# Patient Record
Sex: Female | Born: 1995 | Race: White | Hispanic: No | Marital: Single | Smoking: Current some day smoker
Health system: Southern US, Community
[De-identification: ages and names within clinical notes are randomized; demographics above are authoritative.]

## PROBLEM LIST (undated history)

## (undated) HISTORY — PX: DENTAL SURGERY: SHX609

## (undated) HISTORY — PX: NO PAST SURGERIES: SHX2092

---
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
## 2020-01-07 ENCOUNTER — Ambulatory Visit
Admission: EM | Admit: 2020-01-07 | Discharge: 2020-01-07 | Disposition: A | Discharge status: Home / Self Care | Payer: Managed Care, Other (non HMO) | Attending: Internal Medicine | Admitting: Internal Medicine

## 2020-01-07 ENCOUNTER — Other Ambulatory Visit: Payer: Self-pay

## 2020-01-07 ENCOUNTER — Encounter: Payer: Self-pay | Admitting: Emergency Medicine

## 2020-01-07 ENCOUNTER — Ambulatory Visit (INDEPENDENT_AMBULATORY_CARE_PROVIDER_SITE_OTHER): Payer: Managed Care, Other (non HMO)

## 2020-01-07 DIAGNOSIS — S6991XA Unspecified injury of right wrist, hand and finger(s), initial encounter: Secondary | ICD-10-CM

## 2020-01-07 DIAGNOSIS — S62623A Displaced fracture of medial phalanx of left middle finger, initial encounter for closed fracture: Secondary | ICD-10-CM

## 2020-01-07 NOTE — ED Triage Notes (Signed)
Patient states that she fell on Monday and landed on her right hand.  Patient c/o pain in her right 3rd finger.

## 2020-01-07 NOTE — Discharge Instructions (Addendum)
IMAGING RESULTS:   Acute, comminuted, obliquely oriented fracture of the distal aspect of the middle phalanx of the third digit with extension to the DIP Joint.  Continue to wear your splint.  Do not use the affected finger until you are cleared by orthopedics.  You can ice the finger and take Tylenol Motrin for pain relief.  You should follow-up with orthopedics in the next couple of days since this fracture is a little bit complicated and may require a minor surgery.  You have a condition requiring you to follow up with Orthopedics so please call one of the following office for appointment:   [facility] [address] [place], [place] [address] Phone: [phone]  [facility] [address], [place], [place] [address] Phone: [phone]

## 2020-01-09 NOTE — ED Provider Notes (Signed)
[facility]    CSN: [id number] Arrival date & time: 01/07/20  1352      History   Chief Complaint Chief Complaint  Patient presents with  . Finger Injury  . Fall    HPI Melissa Strong is a 24 y.o. female.   24 year old female presents for injury of right middle finger.  She says that she fell and jammed the finger 6 days ago.  She has had the finger in the splint and felt that it would get better, but it has not.  She has iced the finger and taken NSAIDs.  She admits to continued swelling and pain as well as problems bending the finger.  She is right-handed she denies any previous injury or surgery of the affected hand.  Patient denies any numbness or tingling.  She has no other concerns today.     History reviewed. No pertinent past medical history.  There are no problems to display for this patient.   Past Surgical History:  Procedure Laterality Date  . NO PAST SURGERIES      OB History   No obstetric history on file.      Home Medications    Prior to Admission medications   Not on File    Family History Family History  Problem Relation Age of Onset  . Diabetes Paternal Grandfather     Social History Social History   Tobacco Use  . Smoking status: Never Smoker  . Smokeless tobacco: Never Used  Substance Use Topics  . Alcohol use: No    Alcohol/week: 0.0 standard drinks  . Drug use: No     Allergies   Nickel   Review of Systems Review of Systems  Constitutional: Negative for fatigue and fever.  Musculoskeletal: Positive for arthralgias and joint swelling.  Skin: Negative for color change, rash and wound.  Neurological: Negative for weakness and numbness.  Hematological: Does not bruise/bleed easily.     Physical Exam Triage Vital Signs ED Triage Vitals  Enc Vitals Group     BP 01/07/20 1455 124/79     Pulse Rate 01/07/20 1455 70     Resp 01/07/20 1455 14     Temp 01/07/20 1455 98.7 F (37.1 C)     Temp Source  01/07/20 1455 Oral     SpO2 01/07/20 1455 99 %     Weight 01/07/20 1453 140 lb (63.5 kg)     Height 01/07/20 1453 5\' 2"  (1.575 m)     Head Circumference --      Peak Flow --      Pain Score 01/07/20 1453 4     Pain Loc --      Pain Edu? --      Excl. in GC? --    No data found.  Updated Vital Signs BP 124/79 (BP Location: Left Arm)   Pulse 70   Temp 98.7 F (37.1 C) (Oral)   Resp 14   Ht 5\' 2"  (1.575 m)   Wt 140 lb (63.5 kg)   LMP 01/01/2020   SpO2 99%   BMI 25.61 kg/m       Physical Exam Vitals and nursing note reviewed.  Constitutional:      General: She is not in acute distress.    Appearance: Normal appearance. She is not ill-appearing or toxic-appearing.  HENT:     Head: Normocephalic and atraumatic.  Eyes:     General: No scleral icterus.       Right eye: No  discharge.        Left eye: No discharge.     Conjunctiva/sclera: Conjunctivae normal.  Cardiovascular:     Rate and Rhythm: Normal rate.     Pulses: Normal pulses.  Pulmonary:     Effort: Pulmonary effort is normal. No respiratory distress.  Musculoskeletal:     Right hand: Swelling (diffuse swelling of right middle finger) and bony tenderness (middle phalanx right hand as well as PIP joint) present. Decreased range of motion (decreased ROM at the PIP joint of right third digit). Normal sensation. Normal capillary refill. Normal pulse.     Left hand: Normal.     Cervical back: Neck supple.  Skin:    General: Skin is dry.  Neurological:     General: No focal deficit present.     Mental Status: She is alert. Mental status is at baseline.     Motor: No weakness.     Gait: Gait normal.  Psychiatric:        Mood and Affect: Mood normal.        Behavior: Behavior normal.        Thought Content: Thought content normal.      UC Treatments / Results  Labs (all labs ordered are listed, but only abnormal results are displayed) Labs Reviewed - No data to display  EKG   Radiology No results  found.  Procedures Procedures (including critical care time)  Medications Ordered in UC Medications - No data to display  Initial Impression / Assessment and Plan / UC Course  I have reviewed the triage vital signs and the nursing notes.  Pertinent labs & imaging results that were available during my care of the patient were reviewed by me and considered in my medical decision making (see chart for details).     I have reviewed the x-rays of the right hand.  There is a fracture of the distal aspect of the middle phalanx with some involvement of the DIP.  Patient is already in a splint fits very well, so I advised her to continue wearing the splint.  Also advised her not to use the affected finger.  She should still ice the finger and take NSAIDs or Tylenol for pain relief.  Advised her to follow-up with orthopedics as soon as possible since there is some involvement of the joint.  Patient given copy of images on the CD.  All questions answered and patient agreeable to follow-up with orthopedics.  Final Clinical Impressions(s) / UC Diagnoses   Final diagnoses:  Closed displaced fracture of middle phalanx of left middle finger, initial encounter     Discharge Instructions     IMAGING RESULTS:   Acute, comminuted, obliquely oriented fracture of the distal aspect of the middle phalanx of the third digit with extension to the DIP Joint.  Continue to wear your splint.  Do not use the affected finger until you are cleared by orthopedics.  You can ice the finger and take Tylenol Motrin for pain relief.  You should follow-up with orthopedics in the next couple of days since this fracture is a little bit complicated and may require a minor surgery.  You have a condition requiring you to follow up with Orthopedics so please call one of the following office for appointment:   [facility] [address] [place], [place] [address] Phone: [phone]  [facility] [address] [place], [place] [address] Phone: [phone]     ED Prescriptions  None     PDMP not reviewed this encounter.   Shirlee Latch, PA-C 01/09/20 1952

## 2020-07-04 ENCOUNTER — Other Ambulatory Visit: Payer: Self-pay

## 2020-07-04 ENCOUNTER — Ambulatory Visit
Admission: EM | Admit: 2020-07-04 | Discharge: 2020-07-04 | Disposition: A | Discharge status: Home / Self Care | Payer: Managed Care, Other (non HMO) | Attending: Sports Medicine | Admitting: Sports Medicine

## 2020-07-04 DIAGNOSIS — R059 Cough, unspecified: Secondary | ICD-10-CM

## 2020-07-04 DIAGNOSIS — J0110 Acute frontal sinusitis, unspecified: Secondary | ICD-10-CM

## 2020-07-04 DIAGNOSIS — R0982 Postnasal drip: Secondary | ICD-10-CM

## 2020-07-04 DIAGNOSIS — R448 Other symptoms and signs involving general sensations and perceptions: Secondary | ICD-10-CM

## 2020-07-04 MED ORDER — FLUTICASONE PROPIONATE 50 MCG/ACT NA SUSP: 1.0000 | Freq: Every day | NASAL | 0 refills | Status: DC

## 2020-07-04 MED ORDER — AMOXICILLIN 500 MG PO CAPS: 500.0000 mg | ORAL_CAPSULE | Freq: Three times a day (TID) | ORAL | 0 refills | Status: DC

## 2020-07-04 NOTE — ED Triage Notes (Signed)
Pt c/o cough and nasal congestion for about 1 week. Pt denies f/n/v/d, shob or other symptoms. Pt has taken Nyquil with no improvement in symptoms.

## 2020-07-04 NOTE — Discharge Instructions (Addendum)
Please take the antibiotics as prescribed. Please use the nasal spray as prescribed. Educational handouts were provided. Over-the-counter meds as needed. Consider doing some sinus flushes. Follow-up here as needed.  I hope you get the feeling better, Dr. Zachery Dauer

## 2020-07-04 NOTE — ED Provider Notes (Signed)
[facility]    CSN: [id number] Arrival date & time: 07/04/20  1553      History   Chief Complaint Chief Complaint  Patient presents with  . Cough    HPI Pamila CAMALA TALWAR is a 25 y.o. female.   Patient is a pleasant 25 year old female who presents for evaluation of the above issues.  She also presents today with her daughter who is having similar symptoms.  She is reported about a week of cough with postnasal drip.  She is also complaining of some facial pain and head pressure.  She has been using over-the-counter medication including sinus rinses without any assistance with her symptoms.  No nausea vomiting diarrhea.  No fever shakes chills.  No ear pain or fullness.  No chest pain or shortness of breath.  No abdominal symptoms or urinary symptoms.  She denies any Covid exposure or COVID history.  She has not been vaccinated against COVID or influenza.  No red flag signs or symptoms appreciated on history.     History reviewed. No pertinent past medical history.  There are no problems to display for this patient.   Past Surgical History:  Procedure Laterality Date  . NO PAST SURGERIES      OB History   No obstetric history on file.      Home Medications    Prior to Admission medications   Medication Sig Start Date End Date Taking? Authorizing Provider  amoxicillin (AMOXIL) 500 MG capsule Take 1 capsule (500 mg total) by mouth 3 (three) times daily. 07/04/20  Yes Delton See, MD  ESTARYLLA 0.25-35 MG-MCG tablet Take 1 tablet by mouth daily. 06/05/20  Yes [provider]  fluticasone (FLONASE) 50 MCG/ACT nasal spray Place 1 spray into both nostrils daily. 07/04/20  Yes Delton See, MD    Family History Family History  Problem Relation Age of Onset  . Diabetes Paternal Grandfather     Social History Social History   Tobacco Use  . Smoking status: Never Smoker  . Smokeless tobacco: Never Used  Vaping Use  . Vaping Use: Never used   Substance Use Topics  . Alcohol use: No    Alcohol/week: 0.0 standard drinks  . Drug use: No     Allergies   Nickel   Review of Systems Review of Systems  Constitutional: Negative for activity change, appetite change, chills, diaphoresis, fatigue and fever.  HENT: Positive for congestion, postnasal drip, sinus pressure and sinus pain. Negative for ear discharge, ear pain, rhinorrhea, sneezing and sore throat.   Eyes: Negative.   Respiratory: Positive for cough. Negative for apnea, chest tightness, shortness of breath, wheezing and stridor.   Cardiovascular: Negative for chest pain and palpitations.  Gastrointestinal: Negative for constipation, diarrhea, nausea and vomiting.  Genitourinary: Negative.   Musculoskeletal: Negative.  Negative for myalgias.  Skin: Negative.  Negative for wound.  Neurological: Negative for dizziness, seizures, syncope, light-headedness, numbness and headaches.  All other systems reviewed and are negative.    Physical Exam Triage Vital Signs ED Triage Vitals  Enc Vitals Group     BP 07/04/20 1628 119/67     Pulse Rate 07/04/20 1628 85     Resp 07/04/20 1628 18     Temp 07/04/20 1628 98.6 F (37 C)     Temp Source 07/04/20 1628 Oral     SpO2 07/04/20 1628 99 %     Weight 07/04/20 1627 130 lb (59 kg)     Height 07/04/20 1627 5\' 2"  (  1.575 m)     Head Circumference --      Peak Flow --      Pain Score 07/04/20 1627 0     Pain Loc --      Pain Edu? --      Excl. in GC? --    No data found.  Updated Vital Signs BP 119/67 (BP Location: Left Arm)   Pulse 85   Temp 98.6 F (37 C) (Oral)   Resp 18   Ht 5\' 2"  (1.575 m)   Wt 59 kg   LMP 06/13/2020   SpO2 99%   BMI 23.78 kg/m   Visual Acuity Right Eye Distance:   Left Eye Distance:   Bilateral Distance:    Right Eye Near:   Left Eye Near:    Bilateral Near:     Physical Exam Vitals and nursing note reviewed.  Constitutional:      General: She is not in acute distress.     Appearance: Normal appearance. She is not ill-appearing or toxic-appearing.  HENT:     Head: Normocephalic and atraumatic.     Right Ear: Tympanic membrane normal.     Left Ear: Tympanic membrane normal.     Nose: Congestion present. No rhinorrhea.     Mouth/Throat:     Mouth: Mucous membranes are moist.     Pharynx: Posterior oropharyngeal erythema present. No oropharyngeal exudate.     Tonsils: No tonsillar exudate or tonsillar abscesses.  Eyes:     General: No scleral icterus.       Right eye: No discharge.        Left eye: No discharge.     Extraocular Movements: Extraocular movements intact.     Conjunctiva/sclera: Conjunctivae normal.     Pupils: Pupils are equal, round, and reactive to light.  Cardiovascular:     Rate and Rhythm: Normal rate and regular rhythm.     Pulses: Normal pulses.     Heart sounds: Normal heart sounds. No murmur heard. No friction rub. No gallop.   Pulmonary:     Effort: Pulmonary effort is normal. No respiratory distress.     Breath sounds: Normal breath sounds. No stridor. No wheezing, rhonchi or rales.  Musculoskeletal:     Cervical back: Normal range of motion and neck supple. No rigidity or tenderness.  Lymphadenopathy:     Cervical: Cervical adenopathy present.  Skin:    General: Skin is warm and dry.     Capillary Refill: Capillary refill takes less than 2 seconds.  Neurological:     General: No focal deficit present.     Mental Status: She is alert and oriented to person, place, and time.      UC Treatments / Results  Labs (all labs ordered are listed, but only abnormal results are displayed) Labs Reviewed - No data to display  EKG   Radiology No results found.  Procedures Procedures (including critical care time)  Medications Ordered in UC Medications - No data to display  Initial Impression / Assessment and Plan / UC Course  I have reviewed the triage vital signs and the nursing notes.  Pertinent labs & imaging  results that were available during my care of the patient were reviewed by me and considered in my medical decision making (see chart for details).  Clinical impression:  1 week of facial pain and pressure in her head with postnasal drip and cough.  Seems consistent with sinusitis.  Will treat accordingly.  Treatment plan:  1.  The findings and treatment plan were discussed in detail with the patient.  Patient was in agreement. 2.  We will treat with amoxicillin 500 mg 3 times a day. 3.  Also give her Flonase. 4.  Educational handout was provided.  Encouraged her to do sinus rinses with supportive care. 4.  Over-the-counter meds as needed, Tylenol or Motrin for fever or discomfort.  Plenty of rest plenty fluids. 5.  Offered a work note but she stays at home did not need one. 6.  If she is having a problem she should follow-up with her primary care physician, come back here, or if her symptoms were to worsen in any way that required a higher level of care she should go to the emergency room.  She voiced verbal understanding. 7.  Follow-up here as needed.    Final Clinical Impressions(s) / UC Diagnoses   Final diagnoses:  Acute non-recurrent frontal sinusitis  Cough  Facial pressure  Postnasal drip     Discharge Instructions     Please take the antibiotics as prescribed. Please use the nasal spray as prescribed. Educational handouts were provided. Over-the-counter meds as needed. Consider doing some sinus flushes. Follow-up here as needed.  I hope you get the feeling better, Dr. Zachery Dauer    ED Prescriptions    Medication Sig Dispense Auth. Provider   amoxicillin (AMOXIL) 500 MG capsule Take 1 capsule (500 mg total) by mouth 3 (three) times daily. 21 capsule Delton See, MD   fluticasone [facility]) 50 MCG/ACT nasal spray Place 1 spray into both nostrils daily. 15.8 mL Delton See, MD     PDMP not reviewed this encounter.   Delton See, MD 07/08/20 1221

## 2022-01-17 IMAGING — CR DG FINGER MIDDLE 2+V*R*
3 series · 3 of 3 positions shown · non-contrast
Comparison: None.

CLINICAL DATA: Patient fell on [REDACTED] injuring the third digit.

EXAM:
RIGHT MIDDLE FINGER 2+V

[finger ap]
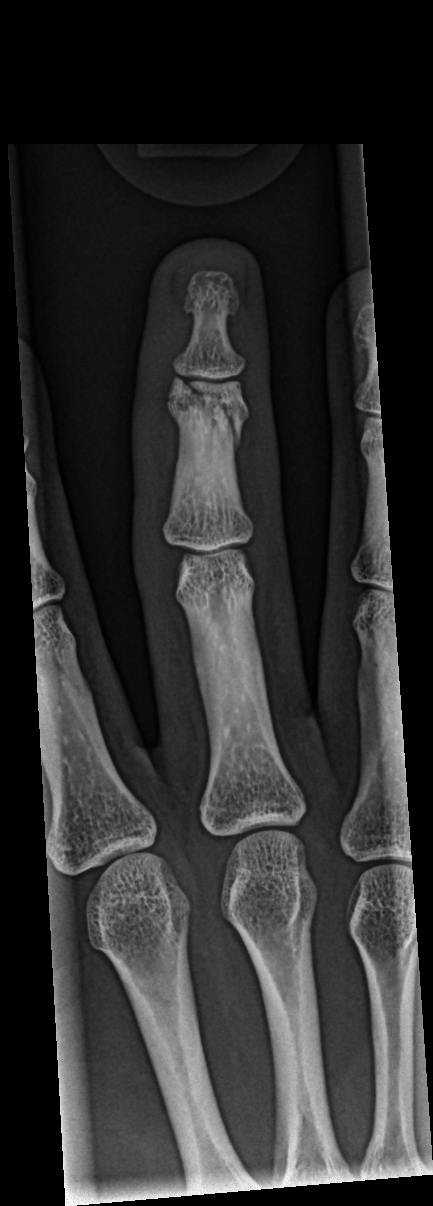

[finger obl]
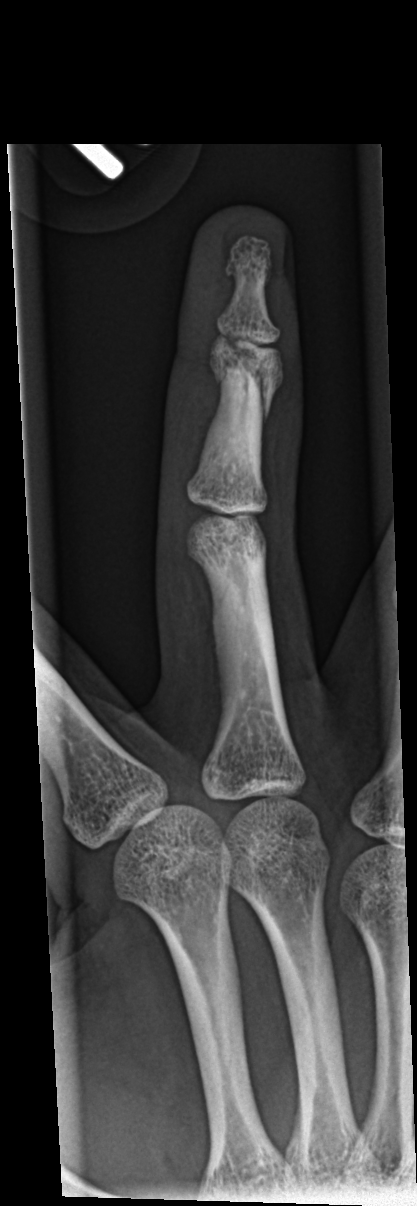

[finger lat]
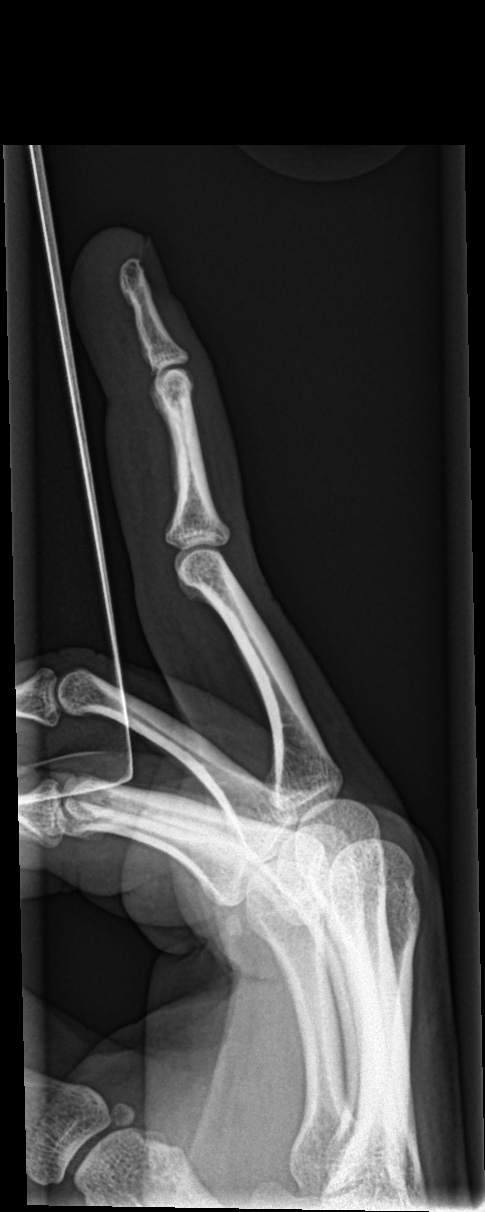

[3 of 3 positions shown; findings below may reference images not displayed]

FINDINGS: There is an acute comminuted obliquely oriented minimally displaced
fracture involving the distal aspect of the middle phalanx of the
third digit with extension to involve the DIP joint. No additional
fractures identified. Expected adjacent soft tissue swelling. No
radiopaque foreign body. No dislocation.
IMPRESSION: Acute, comminuted, obliquely oriented fracture of the distal aspect
of the middle phalanx of the third digit with extension to the DIP
joint.

## 2022-09-26 ENCOUNTER — Ambulatory Visit
Admission: EM | Admit: 2022-09-26 | Discharge: 2022-09-26 | Disposition: A | Discharge status: Home / Self Care | Payer: Managed Care, Other (non HMO) | Attending: Family Medicine | Admitting: Family Medicine

## 2022-09-26 ENCOUNTER — Encounter: Payer: Self-pay | Admitting: Emergency Medicine

## 2022-09-26 DIAGNOSIS — L0501 Pilonidal cyst with abscess: Secondary | ICD-10-CM

## 2022-09-26 MED ORDER — METRONIDAZOLE 500 MG PO TABS: 500.0000 mg | ORAL_TABLET | Freq: Three times a day (TID) | ORAL | 0 refills | Status: DC

## 2022-09-26 MED ORDER — TRAMADOL HCL 50 MG PO TABS: 50.0000 mg | ORAL_TABLET | Freq: Three times a day (TID) | ORAL | 0 refills | Status: DC | PRN

## 2022-09-26 MED ORDER — CEPHALEXIN 500 MG PO CAPS: 500.0000 mg | ORAL_CAPSULE | Freq: Three times a day (TID) | ORAL | 0 refills | Status: DC

## 2022-09-26 NOTE — ED Provider Notes (Signed)
[facility]    CSN: [id number] Arrival date & time: 09/26/22  0931      History   Chief Complaint Chief Complaint  Patient presents with   Tailbone Pain   Abscess    HPI 27 year old female presents for evaluation of the above.  Patient reports that over few days she has had pain of her tailbone.  She states that she had someone look at the area and it was red.  It causes significant pain with ambulation and when she lies down on it.  No drainage from the area.  No fever.  No relieving factors.  No other complaints.    Home Medications    Prior to Admission medications   Medication Sig Start Date End Date Taking? Authorizing Provider  cephALEXin (KEFLEX) 500 MG capsule Take 1 capsule (500 mg total) by mouth 3 (three) times daily. 09/26/22  Yes Kelissa Merlin G, DO  metroNIDAZOLE (FLAGYL) 500 MG tablet Take 1 tablet (500 mg total) by mouth 3 (three) times daily. 09/26/22  Yes Pailynn Vahey G, DO  traMADol (ULTRAM) 50 MG tablet Take 1 tablet (50 mg total) by mouth every 8 (eight) hours as needed for severe pain or moderate pain. 09/26/22  Yes Tommie Sams, DO    Family History Family History  Problem Relation Age of Onset   Diabetes Paternal Grandfather     Social History Social History   Tobacco Use   Smoking status: Never   Smokeless tobacco: Never  Vaping Use   Vaping Use: Never used  Substance Use Topics   Alcohol use: No    Alcohol/week: 0.0 standard drinks of alcohol   Drug use: No     Allergies   Nickel   Review of Systems Review of Systems Per HPI  Physical Exam Triage Vital Signs ED Triage Vitals  Enc Vitals Group     BP 09/26/22 0946 116/75     Pulse Rate 09/26/22 0946 (!) 109     Resp 09/26/22 0946 14     Temp 09/26/22 0946 98.5 F (36.9 C)     Temp Source 09/26/22 0946 Oral     SpO2 09/26/22 0946 96 %     Weight 09/26/22 0943 175 lb (79.4 kg)     Height 09/26/22 0943 5\' 2"  (1.575 m)     Head Circumference --      Peak Flow --       Pain Score 09/26/22 0943 8     Pain Loc --      Pain Edu? --      Excl. in GC? --    No data found.  Updated Vital Signs BP 116/75 (BP Location: Right Arm)   Pulse (!) 109   Temp 98.5 F (36.9 C) (Oral)   Resp 14   Ht 5\' 2"  (1.575 m)   Wt 79.4 kg   LMP 09/05/2022 (Approximate)   SpO2 96%   BMI 32.01 kg/m   Visual Acuity Right Eye Distance:   Left Eye Distance:   Bilateral Distance:    Right Eye Near:   Left Eye Near:    Bilateral Near:     Physical Exam Vitals and nursing note reviewed. Exam conducted with a chaperone present.  Constitutional:      General: She is not in acute distress.    Appearance: Normal appearance.  HENT:     Head: Normocephalic and atraumatic.  Skin:    Comments: Superior aspect of the left buttock at the  gluteal cleft with a firm palpable area with erythema.  Fluctuant.  Neurological:     Mental Status: She is alert.      UC Treatments / Results  Labs (all labs ordered are listed, but only abnormal results are displayed) Labs Reviewed - No data to display  EKG   Radiology No results found.  Procedures Incision and Drainage  Date/Time: 09/26/2022 11:15 AM  Performed by: Tommie Sams, DO Authorized by: Tommie Sams, DO   Consent:    Consent obtained:  Verbal   Consent given by:  Patient   Risks discussed:  Pain   Alternatives discussed:  Alternative treatment Universal protocol:    Patient identity confirmed:  Verbally with patient Location:    Type:  Abscess   Location:  Anogenital   Anogenital location:  Pilonidal Pre-procedure details:    Skin preparation:  Povidone-iodine Anesthesia:    Anesthesia method:  Local infiltration   Local anesthetic:  Lidocaine 1% WITH epi Procedure type:    Complexity:  Simple Procedure details:    Incision types:  Stab incision   Drainage:  Purulent   Drainage amount:  Scant   Wound treatment:  Wound left open   Packing materials:  None Post-procedure details:     Procedure completion:  Tolerated with difficulty  (including critical care time)  Medications Ordered in UC Medications - No data to display  Initial Impression / Assessment and Plan / UC Course  I have reviewed the triage vital signs and the nursing notes.  Pertinent labs & imaging results that were available during my care of the patient were reviewed by me and considered in my medical decision making (see chart for details).    27 year old female presents with pilonidal abscess.  I&D performed today with minimal drainage.  Advised warm soaks.  Antibiotic therapy as directed.  Tramadol as needed for pain.  Advised to follow-up with general surgery if this continues to persist and does not resolve.  Final Clinical Impressions(s) / UC Diagnoses   Final diagnoses:  Pilonidal abscess     Discharge Instructions      Warm soaks.  Medications as prescribed.  If continues to persist, recommend seeing [facility] in [place].   ED Prescriptions     Medication Sig Dispense Auth. Provider   cephALEXin (KEFLEX) 500 MG capsule Take 1 capsule (500 mg total) by mouth 3 (three) times daily. 20 capsule Elray Dains G, DO   metroNIDAZOLE (FLAGYL) 500 MG tablet Take 1 tablet (500 mg total) by mouth 3 (three) times daily. 21 tablet Deyon Chizek G, DO   traMADol (ULTRAM) 50 MG tablet Take 1 tablet (50 mg total) by mouth every 8 (eight) hours as needed for severe pain or moderate pain. 15 tablet Everlene Other G, DO      I have reviewed the PDMP during this encounter.   Tommie Sams, [place] 09/26/22 1118

## 2022-09-26 NOTE — Discharge Instructions (Addendum)
Warm soaks.  Medications as prescribed.  If continues to persist, recommend seeing [facility] in [place].

## 2022-09-26 NOTE — ED Triage Notes (Signed)
Patient c/o tailbone pain and redness and swelling in the area for the past 2-3 days.  Patient reports N/D.  Patient denies injury or fall.  Patient unsure of fevers.

## 2022-09-29 ENCOUNTER — Emergency Department
Admission: EM | Admit: 2022-09-29 | Discharge: 2022-09-29 | Disposition: A | Discharge status: Home / Self Care | Payer: Self-pay | Attending: Emergency Medicine | Admitting: Emergency Medicine

## 2022-09-29 ENCOUNTER — Other Ambulatory Visit: Payer: Self-pay

## 2022-09-29 DIAGNOSIS — L0501 Pilonidal cyst with abscess: Secondary | ICD-10-CM | POA: Insufficient documentation

## 2022-09-29 MED ORDER — HYDROCODONE-ACETAMINOPHEN 5-325 MG PO TABS
2.0000 | ORAL_TABLET | Freq: Once | ORAL | Status: AC
  Administered 2022-09-29: 2 via ORAL
  Filled 2022-09-29: qty 2

## 2022-09-29 MED ORDER — CLINDAMYCIN HCL 150 MG PO CAPS
600.0000 mg | ORAL_CAPSULE | Freq: Once | ORAL | Status: AC
  Administered 2022-09-29: 600 mg via ORAL
  Filled 2022-09-29: qty 4

## 2022-09-29 MED ORDER — CLINDAMYCIN HCL 300 MG PO CAPS: 300.0000 mg | ORAL_CAPSULE | Freq: Four times a day (QID) | ORAL | 0 refills | Status: AC

## 2022-09-29 NOTE — ED Provider Notes (Signed)
[facility] Provider Note    Event Date/Time   First MD Initiated Contact with Patient 09/29/22 1941     (approximate)   History   Wound Check   HPI  Melissa Strong is a 27 y.o. female with no past medical history who presents to the ED today for a wound check. Patient was seen on 09/26/2022 and diagnosed with a pilonidal abscess, which was drained in urgent care.  She was started on Keflex and Flagyl.  Today she noticed increased drainage while she was riding in a car.  She shows me a picture of purulent fluid pooled in the seat, and states that it has continued to ooze and bleed.  She also mentions that she has been very nauseated and has had some abdominal pain since starting the antibiotics.  No fevers, vomiting or diarrhea.      Physical Exam   Triage Vital Signs: ED Triage Vitals  Enc Vitals Group     BP 09/29/22 1839 118/61     Pulse Rate 09/29/22 1839 99     Resp 09/29/22 1839 16     Temp 09/29/22 1839 98.6 F (37 C)     Temp Source 09/29/22 1839 Oral     SpO2 09/29/22 1839 96 %     Weight 09/29/22 1840 175 lb (79.4 kg)     Height 09/29/22 1840 5\' 2"  (1.575 m)     Head Circumference --      Peak Flow --      Pain Score 09/29/22 1840 6     Pain Loc --      Pain Edu? --      Excl. in GC? --     Most recent vital signs: Vitals:   09/29/22 1839  BP: 118/61  Pulse: 99  Resp: 16  Temp: 98.6 F (37 C)  SpO2: 96%    General: Awake, no distress.  CV:  Good peripheral perfusion.  Resp:  Normal effort.  Abd:  No distention.  Skin:  1.5 cm circular draining abscess on the superior aspect of the left buttock at the gluteal cleft with surrounding erythema and fluctuance, TTP.  Fluid is purulent and bloody.   ED Results / Procedures / Treatments   Labs (all labs ordered are listed, but only abnormal results are displayed) Labs Reviewed  AEROBIC/ANAEROBIC CULTURE W GRAM STAIN (SURGICAL/DEEP WOUND)    PROCEDURES:  Critical Care  performed: No  Procedures   MEDICATIONS ORDERED IN ED: Medications  HYDROcodone-acetaminophen (NORCO/VICODIN) 5-325 MG per tablet 2 tablet (2 tablets Oral Given 09/29/22 2020)  clindamycin (CLEOCIN) capsule 600 mg (600 mg Oral Given 09/29/22 2019)     IMPRESSION / MDM / ASSESSMENT AND PLAN / ED COURSE  I reviewed the triage vital signs and the nursing notes.                              Differential diagnosis includes, but is not limited to, pilonidal abscess, pilonidal cyst, perianal abscess, anal fissure, anal fistula.  Patient's presentation is most consistent with acute, uncomplicated illness.  Patient presented to the ED for a wound check after having a pilonidal abscess drained in urgent care on 5/25.  She had an increase in purulent drainage.  She has been taking Keflex and Flagyl x 3 days which have led to GI upset.  Wound culture was obtained to check for MRSA as well as test antibiotic sensitivity.  I changed patient's antibiotics to clindamycin for MRSA coverage.  She was given a dose of oral clindamycin in the ED as well as some Vicodin for pain control.  Wound was covered with an abdominal pad taped to the buttock.  I explained to patient that it will continue to drain and that someone would reach out to her if the bacteria is not sensitive to the antibiotic she was placed on.  I advised her to continue to keep the wound covered to the best of her ability and suggested she try wearing pads to help with the drainage.  I gave her information for general surgery follow-up.  I explained that she may want to start a probiotic given all of her recent antibiotic usage.  She can also take sitz bath's for symptom relief.  Patient verbalized understanding, all questions were answered, patient stable for discharge.     FINAL CLINICAL IMPRESSION(S) / ED DIAGNOSES   Final diagnoses:  Pilonidal abscess    Rx / DC Orders   ED Discharge Orders          Ordered    clindamycin (CLEOCIN)  300 MG capsule  4 times daily        09/29/22 2013             Note:  This document was prepared using Dragon voice recognition software and may include unintentional dictation errors.   Cruz Condon, PA-C 09/29/22 2123    Concha Se, MD 10/03/22 [phone]

## 2022-09-29 NOTE — ED Triage Notes (Signed)
Pt states that she had an abscess at her tailbone that was opened on Saturday at urgent care, pt was placed on antibiotcs Saturday, states today while riding in the car it felt like it burst open and has cont to bleed on ooze since, pt also states that she feels nauseated

## 2022-09-30 LAB — AEROBIC/ANAEROBIC CULTURE W GRAM STAIN (SURGICAL/DEEP WOUND)

## 2022-10-02 LAB — AEROBIC/ANAEROBIC CULTURE W GRAM STAIN (SURGICAL/DEEP WOUND)

## 2022-10-03 LAB — AEROBIC/ANAEROBIC CULTURE W GRAM STAIN (SURGICAL/DEEP WOUND)

## 2022-12-24 ENCOUNTER — Encounter: Payer: Self-pay | Admitting: Surgery

## 2022-12-24 ENCOUNTER — Ambulatory Visit (INDEPENDENT_AMBULATORY_CARE_PROVIDER_SITE_OTHER): Payer: Self-pay | Admitting: Surgery

## 2022-12-24 VITALS — BP 111/74 | HR 61 | Temp 98.3°F | Ht 62.0 in | Wt 175.0 lb

## 2022-12-24 DIAGNOSIS — L0591 Pilonidal cyst without abscess: Secondary | ICD-10-CM

## 2022-12-24 DIAGNOSIS — L0501 Pilonidal cyst with abscess: Secondary | ICD-10-CM

## 2022-12-24 MED ORDER — CLINDAMYCIN HCL 300 MG PO CAPS: 300.0000 mg | ORAL_CAPSULE | Freq: Four times a day (QID) | ORAL | 0 refills | Status: AC

## 2022-12-24 NOTE — Patient Instructions (Signed)
We will send a prescription for antibiotics to your pharmacy. We will send you home with some Silver Nitrate sticks to help probe the wound and keep it open to drain.   You have requested to have your gallbladder removed. This will be done at [facility] with Dr. Claudine Mouton.  You will need to arrange to be out of work for approximately 1-2 weeks and then have a family member change the dressing 1 time a day until this heals from the inside out.  If you have FMLA or disability paperwork that needs filled out you may drop this off at our office or this can be faxed to [phone].  Please see the (blue)pre-care form that you have been given today. Our surgery scheduler will call you to verify surgery date and to go over information.   If you have any questions, please call our office.   Please call our office with any questions or concerns.    Pilonidal Cyst A pilonidal cyst is a fluid-filled sac. It forms beneath the skin near your tailbone, at the top of the crease of your buttocks. A pilonidal cyst that is not large or infected may not cause symptoms or problems. If the cyst becomes irritated or infected, it may fill with pus. This causes pain and swelling (pilonidal abscess). An infected cyst may need to be treated with medicine, drained, or removed. CAUSES The cause of a pilonidal cyst is not known. One cause may be a hair that grows into your skin (ingrown hair). RISK FACTORS Pilonidal cysts are more common in boys and men. Risk factors include: Having lots of hair near the crease of the buttocks. Being overweight. Having a pilonidal dimple. Wearing tight clothing. Not bathing or showering frequently. Sitting for long periods of time. SIGNS AND SYMPTOMS Signs and symptoms of a pilonidal cyst may include: Redness. Pain and tenderness. Warmth. Swelling. Pus. Fever. DIAGNOSIS Your health care provider may diagnose a pilonidal cyst based on your symptoms and a physical  exam. The health care provider may do a blood test to check for infection. If your cyst is draining pus, your health care provider may take a sample of the drainage to be tested at a laboratory. TREATMENT Surgery is the usual treatment for an infected pilonidal cyst. You may also have to take medicines before surgery. The type of surgery you have depends on the size and severity of the infected cyst. The different kinds of surgery include: Incision and drainage. This is a procedure to open and drain the cyst. Marsupialization. In this procedure, a large cyst or abscess may be opened and kept open by stitching the edges of the skin to the cyst walls. Cyst removal. This procedure involves opening the skin and removing all or part of the cyst. HOME CARE INSTRUCTIONS Follow all of your surgeon's instructions carefully if you had surgery. Take medicines only as directed by your health care provider. If you were prescribed an antibiotic medicine, finish it all even if you start to feel better. Keep the area around your pilonidal cyst clean and dry. Clean the area as directed by your health care provider. Pat the area dry with a clean towel. Do not rub it as this may cause bleeding. Remove hair from the area around the cyst as directed by your health care provider. Do not wear tight clothing or sit in one place for long periods of time. There are many different ways to close and cover an incision, including stitches, skin  glue, and adhesive strips. Follow your health care provider's instructions on: Incision care. Bandage (dressing) changes and removal. Incision closure removal. SEEK MEDICAL CARE IF:  You have drainage, redness, swelling, or pain at the site of the cyst. You have a fever.   This information is not intended to replace advice given to you by your health care provider. Make sure you discuss any questions you have with your health care provider.   Document Released: 04/17/2000 Document  Revised: 05/11/2014 Document Reviewed: 09/07/2013 Elsevier Interactive Patient Education 2016 [employer] Pilonidal Cyst Drainage  A pilonidal cyst is a fluid-filled sac that forms under the skin near the tailbone, at the top of the crease of the buttocks (pilonidal area). Incision and drainage is a procedure to open and drain a pilonidal cyst. You may need this procedure if your cyst becomes infected (pilonidal abscess). A pilonidal abscess may cause pain and swelling. There are three types of procedures to drain a pilonidal cyst. The type of procedure you have will depend on the size, location, and severity of your cyst. You may have: Incision and drainage with wound packing. Wound packing involves placing a germ-free packing material (gauze) into the incision. Marsupialization. This involves opening and draining the cyst. The edges of the incision are then stitched (sutured) to the skin around the incision. Incision and drainage without wound packing. The incision is closed and covered with a dressing. Tell a health care provider about: Any allergies you have. All medicines you are taking, including vitamins, herbs, eye drops, creams, and over-the-counter medicines. Any problems you or family members have had with anesthetic medicines. Any bleeding problems you have. Any surgeries you have had. Any medical conditions you have. Whether you are pregnant or may be pregnant. What are the risks? Talk to your health care provider about risks. These may include: Infection. Bleeding. Allergic reactions to medicines. The cyst coming back again (recurrence). What happens before the procedure? Medicines Ask your health care provider about: Changing or stopping your regular medicines. These include any diabetes medicines or blood thinners you take. Taking medicines such as aspirin and ibuprofen. These medicines can thin your blood. Do not take them unless your health care provider tells you  to. Taking over-the-counter medicines, vitamins, herbs, and supplements. Surgery safety Ask your health care provider: How your surgery site will be marked. What steps will be taken to help prevent infection. These steps may include: Removing hair at the surgery site. Washing skin with a soap that kills germs. Taking antibiotics. General instructions Do not use any products that contain nicotine or tobacco for at least 4 weeks before the procedure. These products include cigarettes, chewing tobacco, and vaping devices, such as e-cigarettes. If you need help quitting, ask your health care provider. If you will be going home right after the procedure, plan to have a responsible adult: Take you home from the hospital or clinic. You will not be allowed to drive. Care for you for the time you are told. You may need help with wound care and dressing changes. What happens during the procedure? An IV may be inserted into one of your veins. You may be given: A sedative. This helps you relax. Anesthesia. This will: Numb certain areas of your body. Make you fall asleep for surgery. You will lie down on your stomach. Tape may be used to spread your buttocks. The area around the cyst will be cleaned. An incision will be made in the cyst. Your surgeon may insert a  tube with a light and camera on the end (probe). This is done to see how deep the cyst is. Fluid or pus inside the cyst will be drained. The cyst will be flushed out (irrigated). The rest of the procedure will depend on the type of procedure you are having. For incision and drainage with wound packing: Gauze will be placed into the wound. This keeps the wound open so it can keep draining after surgery. It also helps the wound heal from the inside out. The area will be covered with a dressing. For marsupialization: The edges of the incision will be stitched to your skin. This keeps the wound open while it heals. It also helps deep wounds  heal from the inside out. A dressing will be placed over the wound. For incision and drainage without wound packing: Some tissue around the cyst may be removed. The incision may be closed with sutures, skin glue, or adhesive strips. The incision will be covered with a dressing. These procedures may vary among health care providers and hospitals. What happens after the procedure? Your blood pressure, heart rate, breathing rate, and blood oxygen level will be monitored until you leave the hospital or clinic. You will be given medicine for pain as needed. If you were given a sedative during the procedure, it can affect you for several hours. Do not drive or operate machinery until your health care provider says that it is safe. Summary A pilonidal cyst is a fluid-filled sac that forms under the skin near the tailbone, at the top of the crease of the buttocks (pilonidal area). Incision and drainage is a procedure to open and drain the cyst. The type of procedure you have will depend on the size, location, and severity of your cyst. You may need this procedure if your cyst becomes infected (pilonidal abscess). This information is not intended to replace advice given to you by your health care provider. Make sure you discuss any questions you have with your health care provider. Document Revised: 07/16/2021 Document Reviewed: 07/16/2021 Elsevier Patient Education  2024 [employer].

## 2022-12-24 NOTE — Progress Notes (Signed)
Patient ID: Melissa Strong, female   DOB: 12/21/1995, 27 y.o.   MRN: [id number]  Chief Complaint: Pilonidal abscess, recurrent  History of Present Illness Melissa Strong is a 27 y.o. female with prior history of natal cleft/sacral area abscess, underwent I&D back in May.  The area again swelled up and became quite painful.  Began draining yesterday, with significant pain relief and after evaluation no antibiotics were reinitiated.  Past Medical History No past medical history on file.    Past Surgical History:  Procedure Laterality Date   NO PAST SURGERIES      Allergies  Allergen Reactions   Nickel Hives and Itching    Current Outpatient Medications  Medication Sig Dispense Refill   clindamycin (CLEOCIN) 300 MG capsule Take 1 capsule (300 mg total) by mouth 4 (four) times daily for 7 days. 28 capsule 0   No current facility-administered medications for this visit.    Family History Family History  Problem Relation Age of Onset   Diabetes Paternal Grandfather       Social History Social History   Tobacco Use   Smoking status: Never    Passive exposure: Never   Smokeless tobacco: Never  Vaping Use   Vaping status: Never Used  Substance Use Topics   Alcohol use: No    Alcohol/week: 0.0 standard drinks of alcohol   Drug use: No        Review of Systems  Constitutional: Negative.   HENT: Negative.    Eyes: Negative.   Respiratory: Negative.    Cardiovascular: Negative.   Gastrointestinal: Negative.   Genitourinary: Negative.   Skin: Negative.   Neurological: Negative.   Psychiatric/Behavioral: Negative.       Physical Exam Blood pressure 111/74, pulse 61, temperature 98.3 F (36.8 C), height 5\' 2"  (1.575 m), weight 175 lb (79.4 kg), last menstrual period 12/20/2022, SpO2 98%. Last Weight  Most recent update: 12/24/2022  1:57 PM    Weight  79.4 kg (175 lb)             CONSTITUTIONAL: Well developed, and nourished, appropriately responsive and  aware without distress.   EYES: Sclera non-icteric.   EARS, NOSE, MOUTH AND THROAT: The oropharynx is clear. Oral mucosa is pink and moist.     Hearing is intact to voice.  NECK: Trachea is midline, and there is no jugular venous distension.  LYMPH NODES:  Lymph nodes in the neck are not appreciated. RESPIRATORY:  Lungs are clear, and breath sounds are equal bilaterally.  Normal respiratory effort without pathologic use of accessory muscles. CARDIOVASCULAR: Heart is regular in rate and rhythm.   Well perfused.  GI: The abdomen is  soft, nontender, and nondistended. There were no palpable masses.  I did not appreciate hepatosplenomegaly. GU: At the cephalad aspect of the natal cleft there is a single pit that does not appear inflamed or indurated.  Just cephalad and to the left of this site there is a indurated area with a punctate opening that is discolored and without obvious drainage at this time.  Q-tip probe was too large, so I converted to a silver nitrate probing patency of the sinus tract.  There appears to be no significant residual drainage to require.  Maintenance minimal tenderness but some evidence of induration/edema to the area.  MUSCULOSKELETAL:  Symmetrical muscle tone appreciated in all four extremities.    SKIN: Skin turgor is normal. No pathologic skin lesions appreciated.  NEUROLOGIC:  Motor and sensation appear grossly normal.  Cranial nerves are grossly without defect. PSYCH:  Alert and oriented to person, place and time. Affect is appropriate for situation.  Data Reviewed I have personally reviewed what is currently available of the patient's imaging, recent labs and medical records.   Labs:      No data to display             No data to display            Imaging: Radiological images reviewed:   Within last 24 hrs: No results found.  Assessment    Pilonidal cyst, recurrent abscess.  Currently draining. Patient Active Problem List   Diagnosis Date  Noted   Pilonidal cyst 12/25/2022    Plan    We discussed proceeding with definitive excision.  Will start antibiotics hopefully to ward off any infection prior to her surgery.  Next week.  She will continue to probe with silver nitrate sticks.  We discussed excision with healing by secondary intention.  We discussed various other options and she agrees to pursue that with the least risk of recurrence.  We reviewed the risks of bleeding, infection, anesthesia, the need for postoperative wound care.  Believe she understands and desires to proceed. Face-to-face time spent with the patient and accompanying care providers(if present) was 30 minutes, with more than 50% of the time spent counseling, educating, and coordinating care of the patient.    These notes generated with voice recognition software. I apologize for typographical errors.  Campbell Lerner M.D., FACS 12/25/2022, 8:36 PM

## 2022-12-24 NOTE — H&P (View-Only) (Signed)
 Patient ID: Melissa Strong, female   DOB: 12/21/1995, 27 y.o.   MRN: [id number]  Chief Complaint: Pilonidal abscess, recurrent  History of Present Illness Melissa Strong is a 27 y.o. female with prior history of natal cleft/sacral area abscess, underwent I&D back in May.  The area again swelled up and became quite painful.  Began draining yesterday, with significant pain relief and after evaluation no antibiotics were reinitiated.  Past Medical History No past medical history on file.    Past Surgical History:  Procedure Laterality Date   NO PAST SURGERIES      Allergies  Allergen Reactions   Nickel Hives and Itching    Current Outpatient Medications  Medication Sig Dispense Refill   clindamycin (CLEOCIN) 300 MG capsule Take 1 capsule (300 mg total) by mouth 4 (four) times daily for 7 days. 28 capsule 0   No current facility-administered medications for this visit.    Family History Family History  Problem Relation Age of Onset   Diabetes Paternal Grandfather       Social History Social History   Tobacco Use   Smoking status: Never    Passive exposure: Never   Smokeless tobacco: Never  Vaping Use   Vaping status: Never Used  Substance Use Topics   Alcohol use: No    Alcohol/week: 0.0 standard drinks of alcohol   Drug use: No        Review of Systems  Constitutional: Negative.   HENT: Negative.    Eyes: Negative.   Respiratory: Negative.    Cardiovascular: Negative.   Gastrointestinal: Negative.   Genitourinary: Negative.   Skin: Negative.   Neurological: Negative.   Psychiatric/Behavioral: Negative.       Physical Exam Blood pressure 111/74, pulse 61, temperature 98.3 F (36.8 C), height 5\' 2"  (1.575 m), weight 175 lb (79.4 kg), last menstrual period 12/20/2022, SpO2 98%. Last Weight  Most recent update: 12/24/2022  1:57 PM    Weight  79.4 kg (175 lb)             CONSTITUTIONAL: Well developed, and nourished, appropriately responsive and  aware without distress.   EYES: Sclera non-icteric.   EARS, NOSE, MOUTH AND THROAT: The oropharynx is clear. Oral mucosa is pink and moist.     Hearing is intact to voice.  NECK: Trachea is midline, and there is no jugular venous distension.  LYMPH NODES:  Lymph nodes in the neck are not appreciated. RESPIRATORY:  Lungs are clear, and breath sounds are equal bilaterally.  Normal respiratory effort without pathologic use of accessory muscles. CARDIOVASCULAR: Heart is regular in rate and rhythm.   Well perfused.  GI: The abdomen is  soft, nontender, and nondistended. There were no palpable masses.  I did not appreciate hepatosplenomegaly. GU: At the cephalad aspect of the natal cleft there is a single pit that does not appear inflamed or indurated.  Just cephalad and to the left of this site there is a indurated area with a punctate opening that is discolored and without obvious drainage at this time.  Q-tip probe was too large, so I converted to a silver nitrate probing patency of the sinus tract.  There appears to be no significant residual drainage to require.  Maintenance minimal tenderness but some evidence of induration/edema to the area.  MUSCULOSKELETAL:  Symmetrical muscle tone appreciated in all four extremities.    SKIN: Skin turgor is normal. No pathologic skin lesions appreciated.  NEUROLOGIC:  Motor and sensation appear grossly normal.  Cranial nerves are grossly without defect. PSYCH:  Alert and oriented to person, place and time. Affect is appropriate for situation.  Data Reviewed I have personally reviewed what is currently available of the patient's imaging, recent labs and medical records.   Labs:      No data to display             No data to display            Imaging: Radiological images reviewed:   Within last 24 hrs: No results found.  Assessment    Pilonidal cyst, recurrent abscess.  Currently draining. Patient Active Problem List   Diagnosis Date  Noted   Pilonidal cyst 12/25/2022    Plan    We discussed proceeding with definitive excision.  Will start antibiotics hopefully to ward off any infection prior to her surgery.  Next week.  She will continue to probe with silver nitrate sticks.  We discussed excision with healing by secondary intention.  We discussed various other options and she agrees to pursue that with the least risk of recurrence.  We reviewed the risks of bleeding, infection, anesthesia, the need for postoperative wound care.  Believe she understands and desires to proceed. Face-to-face time spent with the patient and accompanying care providers(if present) was 30 minutes, with more than 50% of the time spent counseling, educating, and coordinating care of the patient.    These notes generated with voice recognition software. I apologize for typographical errors.  Campbell Lerner M.D., FACS 12/25/2022, 8:36 PM

## 2022-12-25 ENCOUNTER — Ambulatory Visit: Payer: Self-pay | Admitting: Surgery

## 2022-12-25 ENCOUNTER — Telehealth: Payer: Self-pay | Admitting: Surgery

## 2022-12-25 DIAGNOSIS — L0591 Pilonidal cyst without abscess: Secondary | ICD-10-CM

## 2022-12-25 NOTE — Telephone Encounter (Signed)
Patient has been advised of Pre-Admission date/time, and Surgery date at [facility].  Surgery Date: 01/01/23 Preadmission Testing Date: 12/28/22 (phone 1p-4p)  Patient has been made aware to call [phone], between 1-3:00pm the day before surgery, to find out what time to arrive for surgery.

## 2022-12-28 ENCOUNTER — Other Ambulatory Visit: Payer: Self-pay

## 2022-12-28 ENCOUNTER — Encounter
Admission: RE | Admit: 2022-12-28 | Discharge: 2022-12-28 | Disposition: A | Discharge status: Home / Self Care | Payer: BC Managed Care – PPO | Source: Ambulatory Visit | Attending: Surgery | Admitting: Surgery

## 2022-12-28 VITALS — Ht 62.0 in | Wt 175.0 lb

## 2022-12-28 DIAGNOSIS — L0591 Pilonidal cyst without abscess: Secondary | ICD-10-CM

## 2022-12-28 NOTE — Patient Instructions (Signed)
Your procedure is scheduled on: Friday 01/01/23 To find out your arrival time, please call [phone] between 1PM - 3PM on:   Thursday 12/31/22 Report to the Registration Desk on the 1st floor of the [facility]. Free Valet parking is available.  If your arrival time is 6:00 am, do not arrive before that time as the [facility] entrance doors do not open until 6:00 am.  REMEMBER: Instructions that are not followed completely may result in serious medical risk, up to and including death; or upon the discretion of your surgeon and anesthesiologist your surgery may need to be rescheduled.  Do not eat food or drink any liquids after midnight the night before surgery.  No gum chewing or hard candies.  One week prior to surgery: Stop Anti-inflammatories (NSAIDS) such as Advil, Aleve, Ibuprofen, Motrin, Naproxen, Naprosyn and Aspirin based products such as Excedrin, Goody's Powder, BC Powder. You may however, continue to take Tylenol if needed for pain up until the day of surgery.  Stop ANY OVER THE COUNTER supplements until after surgery.  Continue taking all prescribed medications.   TAKE ONLY THESE MEDICATIONS THE MORNING OF SURGERY WITH A SIP OF WATER:  none  No Alcohol for 24 hours before or after surgery.  No Smoking including e-cigarettes for 24 hours before surgery.  No chewable tobacco products for at least 6 hours before surgery.  No nicotine patches on the day of surgery.  Do not use any "recreational" drugs for at least a week (preferably 2 weeks) before your surgery.  Please be advised that the combination of cocaine and anesthesia may have negative outcomes, up to and including death. If you test positive for cocaine, your surgery will be cancelled.  On the morning of surgery brush your teeth with toothpaste and water, you may rinse your mouth with mouthwash if you wish. Do not swallow any toothpaste or mouthwash.  Use CHG Soap or wipes as directed on instruction  sheet. You may pick this up at our office [address] or your local drugstore.  Do not wear lotions, powders, or perfumes.   Do not shave body hair from the neck down 48 hours before surgery.  Wear comfortable clothing (specific to your surgery type) to the hospital.  Do not wear jewelry, make-up, hairpins, clips or nail polish.  Contact lenses, hearing aids and dentures may not be worn into surgery.  Do not bring valuables to the hospital. [facility] is not responsible for any missing/lost belongings or valuables.   Notify your doctor if there is any change in your medical condition (cold, fever, infection).  If you are being discharged the day of surgery, you will not be allowed to drive home. You will need a responsible individual to drive you home and stay with you for 24 hours after surgery.   If you are taking public transportation, you will need to have a responsible individual with you.  If you are being admitted to the hospital overnight, leave your suitcase in the car. After surgery it may be brought to your room.  In case of increased patient census, it may be necessary for you, the patient, to continue your postoperative care in the Same Day Surgery department.  After surgery, you can help prevent lung complications by doing breathing exercises.  Take deep breaths and cough every 1-2 hours. Your doctor may order a device called an Incentive Spirometer to help you take deep breaths. When coughing or sneezing, hold  a pillow firmly against your incision with both hands. This is called "splinting." Doing this helps protect your incision. It also decreases belly discomfort.  Surgery Visitation Policy:  Patients undergoing a surgery or procedure may have two family members or support persons with them as long as the person is not COVID-19 positive or experiencing its symptoms.   Inpatient Visitation:    Visiting hours are 7 a.m. to 8 p.m. Up to four  visitors are allowed at one time in a patient room. The visitors may rotate out with other people during the day. One designated support person (adult) may remain overnight.  Please call the Pre-admissions Testing Dept. at [phone] if you have any questions about these instructions.     Preparing for Surgery with CHLORHEXIDINE GLUCONATE (CHG) Soap  Chlorhexidine Gluconate (CHG) Soap  o An antiseptic cleaner that kills germs and bonds with the skin to continue killing germs even after washing  o Used for showering the night before surgery and morning of surgery  Before surgery, you can play an important role by reducing the number of germs on your skin.  CHG (Chlorhexidine gluconate) soap is an antiseptic cleanser which kills germs and bonds with the skin to continue killing germs even after washing.  Please do not use if you have an allergy to CHG or antibacterial soaps. If your skin becomes reddened/irritated stop using the CHG.  1. Shower the NIGHT BEFORE SURGERY and the MORNING OF SURGERY with CHG soap.  2. If you choose to wash your hair, wash your hair first as usual with your normal shampoo.  3. After shampooing, rinse your hair and body thoroughly to remove the shampoo.  4. Use CHG as you would any other liquid soap. You can apply CHG directly to the skin and wash gently with a scrungie or a clean washcloth.  5. Apply the CHG soap to your body only from the neck down. Do not use on open wounds or open sores. Avoid contact with your eyes, ears, mouth, and genitals (private parts). Wash face and genitals (private parts) with your normal soap.  6. Wash thoroughly, paying special attention to the area where your surgery will be performed.  7. Thoroughly rinse your body with warm water.  8. Do not shower/wash with your normal soap after using and rinsing off the CHG soap.  9. Pat yourself dry with a clean towel.  10. Wear clean pajamas to bed the night before  surgery.  12. Place clean sheets on your bed the night of your first shower and do not sleep with pets.  13. Shower again with the CHG soap on the day of surgery prior to arriving at the hospital.  14. Do not apply any deodorants/lotions/powders.  15. Please wear clean clothes to the hospital.

## 2022-12-31 MED ORDER — CELECOXIB 200 MG PO CAPS
200.0000 mg | ORAL_CAPSULE | ORAL | Status: AC
  Administered 2023-01-01: 200 mg via ORAL

## 2022-12-31 MED ORDER — ORAL CARE MOUTH RINSE: 15.0000 mL | Freq: Once | OROMUCOSAL | Status: AC

## 2022-12-31 MED ORDER — LACTATED RINGERS IV SOLN: INTRAVENOUS | Status: DC

## 2022-12-31 MED ORDER — CHLORHEXIDINE GLUCONATE CLOTH 2 % EX PADS: 6.0000 | MEDICATED_PAD | Freq: Once | CUTANEOUS | Status: DC

## 2022-12-31 MED ORDER — CEFAZOLIN SODIUM-DEXTROSE 2-4 GM/100ML-% IV SOLN
2.0000 g | INTRAVENOUS | Status: AC
  Administered 2023-01-01: 2 g via INTRAVENOUS

## 2022-12-31 MED ORDER — BUPIVACAINE LIPOSOME 1.3 % IJ SUSP: 20.0000 mL | Freq: Once | INTRAMUSCULAR | Status: DC

## 2022-12-31 MED ORDER — ACETAMINOPHEN 500 MG PO TABS
1000.0000 mg | ORAL_TABLET | ORAL | Status: AC
  Administered 2023-01-01: 1000 mg via ORAL

## 2022-12-31 MED ORDER — GABAPENTIN 300 MG PO CAPS
300.0000 mg | ORAL_CAPSULE | ORAL | Status: AC
  Administered 2023-01-01: 300 mg via ORAL

## 2022-12-31 MED ORDER — CHLORHEXIDINE GLUCONATE CLOTH 2 % EX PADS
6.0000 | MEDICATED_PAD | Freq: Once | CUTANEOUS | Status: AC
  Administered 2023-01-01: 6 via TOPICAL

## 2022-12-31 MED ORDER — CHLORHEXIDINE GLUCONATE 0.12 % MT SOLN
15.0000 mL | Freq: Once | OROMUCOSAL | Status: AC
  Administered 2023-01-01: 15 mL via OROMUCOSAL

## 2022-12-31 MED ORDER — FAMOTIDINE 20 MG PO TABS
20.0000 mg | ORAL_TABLET | Freq: Once | ORAL | Status: AC
  Administered 2023-01-01: 20 mg via ORAL

## 2023-01-01 ENCOUNTER — Other Ambulatory Visit: Payer: Self-pay

## 2023-01-01 ENCOUNTER — Other Ambulatory Visit: Payer: Self-pay | Admitting: Surgery

## 2023-01-01 ENCOUNTER — Ambulatory Visit: Payer: BC Managed Care – PPO | Admitting: Urgent Care

## 2023-01-01 ENCOUNTER — Encounter: Payer: Self-pay | Admitting: Surgery

## 2023-01-01 ENCOUNTER — Encounter
Admission: RE | Disposition: A | Discharge status: Home / Self Care | Payer: Self-pay | Source: Home / Self Care | Attending: Surgery

## 2023-01-01 ENCOUNTER — Ambulatory Visit
Admission: RE | Admit: 2023-01-01 | Discharge: 2023-01-01 | Disposition: A | Discharge status: Home / Self Care | Payer: BC Managed Care – PPO | Attending: Surgery | Admitting: Surgery

## 2023-01-01 DIAGNOSIS — L0591 Pilonidal cyst without abscess: Secondary | ICD-10-CM

## 2023-01-01 HISTORY — PX: PILONIDAL CYST EXCISION: SHX744

## 2023-01-01 LAB — POCT PREGNANCY, URINE: Preg Test, Ur: NEGATIVE

## 2023-01-01 SURGERY — EXCISION, PILONIDAL CYST, EXTENSIVE
Anesthesia: General | Site: Buttocks

## 2023-01-01 MED ORDER — MIDAZOLAM HCL 2 MG/2ML IJ SOLN
INTRAMUSCULAR | Status: DC | PRN
  Administered 2023-01-01: 2 mg via INTRAVENOUS

## 2023-01-01 MED ORDER — METHYLENE BLUE (ANTIDOTE) 1 % IV SOLN
INTRAVENOUS | Status: AC
  Filled 2023-01-01: qty 10

## 2023-01-01 MED ORDER — CHLORHEXIDINE GLUCONATE 0.12 % MT SOLN
OROMUCOSAL | Status: AC
  Filled 2023-01-01: qty 15

## 2023-01-01 MED ORDER — CELECOXIB 200 MG PO CAPS
ORAL_CAPSULE | ORAL | Status: AC
  Filled 2023-01-01: qty 1

## 2023-01-01 MED ORDER — ACETAMINOPHEN 500 MG PO TABS
ORAL_TABLET | ORAL | Status: AC
  Filled 2023-01-01: qty 2

## 2023-01-01 MED ORDER — PROPOFOL 10 MG/ML IV BOLUS
INTRAVENOUS | Status: DC | PRN
Start: 2023-01-01 — End: 2023-01-01
  Administered 2023-01-01: 200 mg via INTRAVENOUS

## 2023-01-01 MED ORDER — ONDANSETRON HCL 4 MG/2ML IJ SOLN
INTRAMUSCULAR | Status: AC
  Filled 2023-01-01: qty 2

## 2023-01-01 MED ORDER — METHYLENE BLUE (ANTIDOTE) 1 % IV SOLN
INTRAVENOUS | Status: DC | PRN
  Administered 2023-01-01: 10 mL via INTRADERMAL

## 2023-01-01 MED ORDER — ROCURONIUM BROMIDE 100 MG/10ML IV SOLN
INTRAVENOUS | Status: DC | PRN
  Administered 2023-01-01: 50 mg via INTRAVENOUS

## 2023-01-01 MED ORDER — FENTANYL CITRATE (PF) 100 MCG/2ML IJ SOLN: 25.0000 ug | INTRAMUSCULAR | Status: DC | PRN

## 2023-01-01 MED ORDER — IBUPROFEN 800 MG PO TABS: 800.0000 mg | ORAL_TABLET | Freq: Three times a day (TID) | ORAL | 0 refills | Status: AC | PRN

## 2023-01-01 MED ORDER — ROCURONIUM BROMIDE 10 MG/ML (PF) SYRINGE
PREFILLED_SYRINGE | INTRAVENOUS | Status: AC
  Filled 2023-01-01: qty 10

## 2023-01-01 MED ORDER — 0.9 % SODIUM CHLORIDE (POUR BTL) OPTIME
TOPICAL | Status: DC | PRN
  Administered 2023-01-01: 500 mL

## 2023-01-01 MED ORDER — PHENYLEPHRINE HCL (PRESSORS) 10 MG/ML IV SOLN
INTRAVENOUS | Status: DC | PRN
  Administered 2023-01-01: 80 ug via INTRAVENOUS

## 2023-01-01 MED ORDER — FAMOTIDINE 20 MG PO TABS
ORAL_TABLET | ORAL | Status: AC
  Filled 2023-01-01: qty 1

## 2023-01-01 MED ORDER — LIDOCAINE HCL (CARDIAC) PF 100 MG/5ML IV SOSY
PREFILLED_SYRINGE | INTRAVENOUS | Status: DC | PRN
  Administered 2023-01-01: 80 mg via INTRAVENOUS

## 2023-01-01 MED ORDER — FENTANYL CITRATE (PF) 100 MCG/2ML IJ SOLN
INTRAMUSCULAR | Status: AC
  Filled 2023-01-01: qty 2

## 2023-01-01 MED ORDER — OXYCODONE-ACETAMINOPHEN 5-325 MG PO TABS
1.0000 | ORAL_TABLET | ORAL | 0 refills | Status: AC | PRN
Start: 2023-01-01 — End: ?

## 2023-01-01 MED ORDER — SUGAMMADEX SODIUM 200 MG/2ML IV SOLN
INTRAVENOUS | Status: DC | PRN
  Administered 2023-01-01: 200 mg via INTRAVENOUS

## 2023-01-01 MED ORDER — CEFAZOLIN SODIUM-DEXTROSE 2-4 GM/100ML-% IV SOLN
INTRAVENOUS | Status: AC
  Filled 2023-01-01: qty 100

## 2023-01-01 MED ORDER — ONDANSETRON HCL 4 MG/2ML IJ SOLN
INTRAMUSCULAR | Status: DC | PRN
  Administered 2023-01-01: 4 mg via INTRAVENOUS

## 2023-01-01 MED ORDER — HYDROGEN PEROXIDE 3 % EX SOLN
CUTANEOUS | Status: DC | PRN
  Administered 2023-01-01: 1

## 2023-01-01 MED ORDER — LIDOCAINE HCL (PF) 2 % IJ SOLN
INTRAMUSCULAR | Status: AC
  Filled 2023-01-01: qty 5

## 2023-01-01 MED ORDER — PROPOFOL 10 MG/ML IV BOLUS
INTRAVENOUS | Status: AC
  Filled 2023-01-01: qty 20

## 2023-01-01 MED ORDER — BUPIVACAINE-EPINEPHRINE 0.25% -1:200000 IJ SOLN
INTRAMUSCULAR | Status: DC | PRN
  Administered 2023-01-01: 40 mL

## 2023-01-01 MED ORDER — OXYCODONE HCL 5 MG PO TABS
5.0000 mg | ORAL_TABLET | Freq: Once | ORAL | Status: AC | PRN
  Administered 2023-01-01: 5 mg via ORAL

## 2023-01-01 MED ORDER — DEXAMETHASONE SODIUM PHOSPHATE 10 MG/ML IJ SOLN
INTRAMUSCULAR | Status: DC | PRN
  Administered 2023-01-01: 10 mg via INTRAVENOUS

## 2023-01-01 MED ORDER — OXYCODONE HCL 5 MG/5ML PO SOLN: 5.0000 mg | Freq: Once | ORAL | Status: AC | PRN

## 2023-01-01 MED ORDER — MIDAZOLAM HCL 2 MG/2ML IJ SOLN
INTRAMUSCULAR | Status: AC
  Filled 2023-01-01: qty 2

## 2023-01-01 MED ORDER — BUPIVACAINE LIPOSOME 1.3 % IJ SUSP
INTRAMUSCULAR | Status: AC
  Filled 2023-01-01: qty 10

## 2023-01-01 MED ORDER — BUPIVACAINE-EPINEPHRINE (PF) 0.25% -1:200000 IJ SOLN
INTRAMUSCULAR | Status: AC
  Filled 2023-01-01: qty 30

## 2023-01-01 MED ORDER — GABAPENTIN 300 MG PO CAPS
ORAL_CAPSULE | ORAL | Status: AC
  Filled 2023-01-01: qty 1

## 2023-01-01 MED ORDER — OXYCODONE HCL 5 MG PO TABS
ORAL_TABLET | ORAL | Status: AC
  Filled 2023-01-01: qty 1

## 2023-01-01 MED ORDER — FENTANYL CITRATE (PF) 100 MCG/2ML IJ SOLN
INTRAMUSCULAR | Status: DC | PRN
  Administered 2023-01-01 (×2): 50 ug via INTRAVENOUS

## 2023-01-01 MED ORDER — DEXAMETHASONE SODIUM PHOSPHATE 10 MG/ML IJ SOLN
INTRAMUSCULAR | Status: AC
  Filled 2023-01-01: qty 1

## 2023-01-01 SURGICAL SUPPLY — 28 items
BLADE SURG 15 STRL LF DISP TIS (BLADE) ×1 IMPLANT
BLADE SURG 15 STRL SS (BLADE) ×2
BRIEF MESH DISP 2XL (UNDERPADS AND DIAPERS) ×1 IMPLANT
DRAPE LAPAROTOMY 100X77 ABD (DRAPES) ×1 IMPLANT
ELECT CAUTERY BLADE TIP 2.5 (TIP) ×1
ELECT REM PT RETURN 9FT ADLT (ELECTROSURGICAL) ×1
ELECTRODE CAUTERY BLDE TIP 2.5 (TIP) ×1 IMPLANT
ELECTRODE REM PT RTRN 9FT ADLT (ELECTROSURGICAL) ×1 IMPLANT
GAUZE 4X4 16PLY ~~LOC~~+RFID DBL (SPONGE) ×1 IMPLANT
GAUZE PACKING 0.25INX5YD STRL (GAUZE/BANDAGES/DRESSINGS) ×1 IMPLANT
GAUZE PACKING 1/2INX5YD STRL (GAUZE/BANDAGES/DRESSINGS) IMPLANT
GAUZE SPONGE 4X4 12PLY STRL (GAUZE/BANDAGES/DRESSINGS) ×1 IMPLANT
GAUZE VASELINE 3X9 (GAUZE/BANDAGES/DRESSINGS) ×1 IMPLANT
GLOVE ORTHO TXT STRL SZ7.5 (GLOVE) ×2 IMPLANT
GOWN STRL REUS W/ TWL LRG LVL3 (GOWN DISPOSABLE) ×1 IMPLANT
GOWN STRL REUS W/ TWL XL LVL3 (GOWN DISPOSABLE) ×1 IMPLANT
GOWN STRL REUS W/TWL LRG LVL3 (GOWN DISPOSABLE) ×1
GOWN STRL REUS W/TWL XL LVL3 (GOWN DISPOSABLE) ×1
KIT TURNOVER KIT A (KITS) ×1 IMPLANT
MANIFOLD NEPTUNE II (INSTRUMENTS) ×1 IMPLANT
NDL HYPO 22X1.5 SAFETY MO (MISCELLANEOUS) ×2 IMPLANT
NEEDLE HYPO 22X1.5 SAFETY MO (MISCELLANEOUS) ×2
PACK BASIN MINOR ARMC (MISCELLANEOUS) ×1 IMPLANT
SOL PREP PVP 2OZ (MISCELLANEOUS) ×1
SOLUTION PREP PVP 2OZ (MISCELLANEOUS) ×1 IMPLANT
SYR 10ML LL (SYRINGE) ×1 IMPLANT
SYR 20ML LL LF (SYRINGE) ×2 IMPLANT
TRAP FLUID SMOKE EVACUATOR (MISCELLANEOUS) ×1 IMPLANT

## 2023-01-01 NOTE — Transfer of Care (Signed)
Immediate Anesthesia Transfer of Care Note  Patient: Melissa Strong  Procedure(s) Performed: CYST EXCISION PILONIDAL EXTENSIVE (Buttocks)  Patient Location: PACU  Anesthesia Type:General  Level of Consciousness: awake, alert , and oriented  Airway & Oxygen Therapy: Patient Spontanous Breathing and Patient connected to face mask oxygen  Post-op Assessment: Report given to RN and Post -op Vital signs reviewed and stable  Post vital signs: Reviewed and stable  Last Vitals:  Vitals Value Taken Time  BP 120/73 01/01/23 1503  Temp    Pulse 101 01/01/23 1505  Resp 16 01/01/23 1505  SpO2 100 % 01/01/23 1505  Vitals shown include unfiled device data.  Last Pain:  Vitals:   01/01/23 1257  TempSrc: Temporal  PainSc: 0-No pain         Complications: No notable events documented.

## 2023-01-01 NOTE — Op Note (Signed)
Excision of pilonidal cyst/sinus, extensive.  Pre-operative Diagnosis: Pilonidal cystic disease, history of abscess.  Post-operative Diagnosis: same.    Surgeon: Campbell Lerner, M.D., [facility]  Anesthesia: General Endotracheal  Findings: 2 cm cavity at depth of deep fascia, minimal midline extension, no additional communication or pits present.   Estimated Blood Loss: 10 mL         Specimens: Skin and subcutaneous tissues with pilonidal cyst and sinus tracts.          Complications: none              Procedure Details  The patient was seen again in the Holding Room. The benefits, complications, treatment options, and expected outcomes were discussed with the patient. The risks of bleeding, infection, recurrence of symptoms, failure to resolve symptoms, unanticipated injury, prosthetic placement, prosthetic infection, any of which could require further surgery were reviewed with the patient. The likelihood of improving the patient's symptoms with return to their baseline status is anticipated.  The patient and/or family concurred with the proposed plan, giving informed consent.  The patient was taken to Operating Room, identified and the procedure verified.    Prior to the induction of general anesthesia, antibiotic prophylaxis was administered. VTE prophylaxis was in place.  General endotracheal anesthesia was then administered and tolerated well. After the induction, the patient was positioned in the prone position and the natal cleft/sacral region was prepped with Chloraprep and draped in the sterile fashion.  A Time Out was held and the above information confirmed.  Utilizing a catheter, a 20-gauge Angiocath, a mixture of methylene blue with hydrogen peroxide on a one-to-one ratio was instilled via the pilonidal sinus opening utilizing the catheter to stain the diseased tracts and tunnels and cyst. Sharp incision with 15 blade was utilized to excise the sinus tracts, open the underlying  cyst cavity and excised all of the methylene blue stained tissues.  Hemostasis was maintained with electrocautery.  Further evaluation and investigation was done to ensure that adequate removal of the pilonidal disease was completed.  Wound was then irrigated.  Local anesthesia of Exparel with quarter percent Marcaine with epinephrine is infiltrated into the adjacent tissues and base of the wound.  The wound was then packed with 1/2inch plain packing strip covered with Vaseline gauze, 4 x 4's ABD pad and secured with tape/elastic briefs.      Campbell Lerner M.D., [facility] 01/01/2023 2:55 PM

## 2023-01-01 NOTE — Anesthesia Preprocedure Evaluation (Signed)
Anesthesia Evaluation  Patient identified by MRN, date of birth, ID band Patient awake    Reviewed: Allergy & Precautions, NPO status , Patient's Chart, lab work & pertinent test results  Airway Mallampati: III  TM Distance: >3 FB Neck ROM: full    Dental  (+) Chipped, Dental Advidsory Given   Pulmonary neg pulmonary ROS, Current Smoker   Pulmonary exam normal        Cardiovascular negative cardio ROS Normal cardiovascular exam     Neuro/Psych negative neurological ROS  negative psych ROS   GI/Hepatic negative GI ROS, Neg liver ROS,,,  Endo/Other  negative endocrine ROS    Renal/GU      Musculoskeletal   Abdominal   Peds  Hematology negative hematology ROS (+)   Anesthesia Other Findings History reviewed. No pertinent past medical history.  Past Surgical History: No date: DENTAL SURGERY  BMI    Body Mass Index: 32.02 kg/m      Reproductive/Obstetrics negative OB ROS                             Anesthesia Physical Anesthesia Plan  ASA: 2  Anesthesia Plan: General ETT and General   Post-op Pain Management:    Induction: Intravenous  PONV Risk Score and Plan: 3 and Midazolam, Ondansetron and Dexamethasone  Airway Management Planned: Oral ETT  Additional Equipment:   Intra-op Plan:   Post-operative Plan: Extubation in OR  Informed Consent: I have reviewed the patients History and Physical, chart, labs and discussed the procedure including the risks, benefits and alternatives for the proposed anesthesia with the patient or authorized representative who has indicated his/her understanding and acceptance.     Dental Advisory Given  Plan Discussed with: Anesthesiologist, CRNA and Surgeon  Anesthesia Plan Comments: (Patient consented for risks of anesthesia including but not limited to:  - adverse reactions to medications - damage to eyes, teeth, lips or other oral  mucosa - nerve damage due to positioning  - sore throat or hoarseness - Damage to heart, brain, nerves, lungs, other parts of body or loss of life  Patient voiced understanding.)       Anesthesia Quick Evaluation

## 2023-01-01 NOTE — Interval H&P Note (Signed)
History and Physical Interval Note:  01/01/2023 1:24 PM  Melissa Strong  has presented today for surgery, with the diagnosis of pilonidal cyst.  The various methods of treatment have been discussed with the patient and family. After consideration of risks, benefits and other options for treatment, the patient has consented to  Procedure(s): CYST EXCISION PILONIDAL EXTENSIVE (N/A) as a surgical intervention.  The patient's history has been reviewed, patient examined, no change in status, stable for surgery.  I have reviewed the patient's chart and labs.  Questions were answered to the patient's satisfaction.     Campbell Lerner

## 2023-01-01 NOTE — Anesthesia Procedure Notes (Addendum)
Procedure Name: Intubation Date/Time: 01/01/2023 2:09 PM  Performed by: Keneshia Tena, [place], CRNAPre-anesthesia Checklist: Patient identified, Patient being monitored, Timeout performed, Emergency Drugs available and Suction available Patient Re-evaluated:Patient Re-evaluated prior to induction Oxygen Delivery Method: Circle system utilized Preoxygenation: Pre-oxygenation with 100% oxygen Induction Type: IV induction Ventilation: Mask ventilation without difficulty Laryngoscope Size: McGraph and 4 Grade View: Grade I Tube type: Oral Tube size: 7.0 mm Number of attempts: 1 Airway Equipment and Method: Stylet Placement Confirmation: ETT inserted through vocal cords under direct vision, positive ETCO2 and breath sounds checked- equal and bilateral Secured at: 22 cm Tube secured with: Tape Dental Injury: Teeth and Oropharynx as per pre-operative assessment

## 2023-01-01 NOTE — Discharge Instructions (Addendum)
AMBULATORY SURGERY  DISCHARGE INSTRUCTIONS   The drugs that you were given will stay in your system until tomorrow so for the next 24 hours you should not:  Drive an automobile Make any legal decisions Drink any alcoholic beverage   You may resume regular meals tomorrow.  Today it is better to start with liquids and gradually work up to solid foods.  You may eat anything you prefer, but it is better to start with liquids, then soup and crackers, and gradually work up to solid foods.   Please notify your doctor immediately if you have any unusual bleeding, trouble breathing, redness and pain at the surgery site, drainage, fever, or pain not relieved by medication.    Additional Instructions: PLEASE LEAVE TEAL BRACELET ON FOR 4 DAYS        Please contact your physician with any problems or Same Day Surgery at [phone], Monday through Friday 6 am to 4 pm, or [place] at [facility] number at [phone].

## 2023-01-02 ENCOUNTER — Encounter: Payer: Self-pay | Admitting: Surgery

## 2023-01-05 ENCOUNTER — Telehealth: Payer: Self-pay | Admitting: *Deleted

## 2023-01-05 NOTE — Telephone Encounter (Signed)
Faxed FMLA to [employer] to [phone]

## 2023-01-06 NOTE — Anesthesia Postprocedure Evaluation (Signed)
Anesthesia Post Note  Patient: Melissa Strong [facility]  Procedure(s) Performed: CYST EXCISION PILONIDAL EXTENSIVE (Buttocks)  Patient location during evaluation: PACU Anesthesia Type: General Level of consciousness: awake and alert Pain management: pain level controlled Vital Signs Assessment: post-procedure vital signs reviewed and stable Respiratory status: spontaneous breathing, nonlabored ventilation, respiratory function stable and patient connected to nasal cannula oxygen Cardiovascular status: blood pressure returned to baseline and stable Postop Assessment: no apparent nausea or vomiting Anesthetic complications: no   No notable events documented.   Last Vitals:  Vitals:   01/01/23 1530 01/01/23 1541  BP: 111/68 107/65  Pulse: 73 78  Resp: (!) 22 16  Temp: (!) 36.3 C (!) 36.2 C  SpO2: 98% 97%    Last Pain:  Vitals:   01/01/23 1541  TempSrc: Temporal  PainSc: 8                  Stephanie Coup

## 2023-01-21 ENCOUNTER — Encounter: Payer: BC Managed Care – PPO | Admitting: Surgery

## 2023-02-02 ENCOUNTER — Encounter: Payer: BC Managed Care – PPO | Admitting: Surgery

## 2023-02-02 NOTE — Progress Notes (Deleted)
[facility] POST-OP OFFICE VISIT  02/02/2023  HPI: Melissa Strong is a 27 y.o. female *** days s/p ***  Vital signs: There were no vitals taken for this visit.   Physical Exam: Constitutional: *** Abdomen: *** Skin: ***  Assessment/Plan: This is a 27 y.o. female *** days s/p ***  Patient Active Problem List   Diagnosis Date Noted   Pilonidal cyst 12/25/2022    - ***   Campbell Lerner M.D., FACS 02/02/2023, 12:00 PM
